# Patient Record
Sex: Male | Born: 2005 | Race: White | Hispanic: Yes | Marital: Single | Smoking: Never smoker
Health system: Southern US, Community
[De-identification: ages and names within clinical notes are randomized; demographics above are authoritative.]

---
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
## 2023-10-01 IMAGING — DX DG NASAL BONES 3+V
3 series · 3 of 3 positions shown · non-contrast
Comparison: None.

CLINICAL DATA: Trauma to nose while wrestling today. Nasal pain,
swelling, and deformity.

EXAM:
NASAL BONES - 3+ VIEW

[[person_name]]
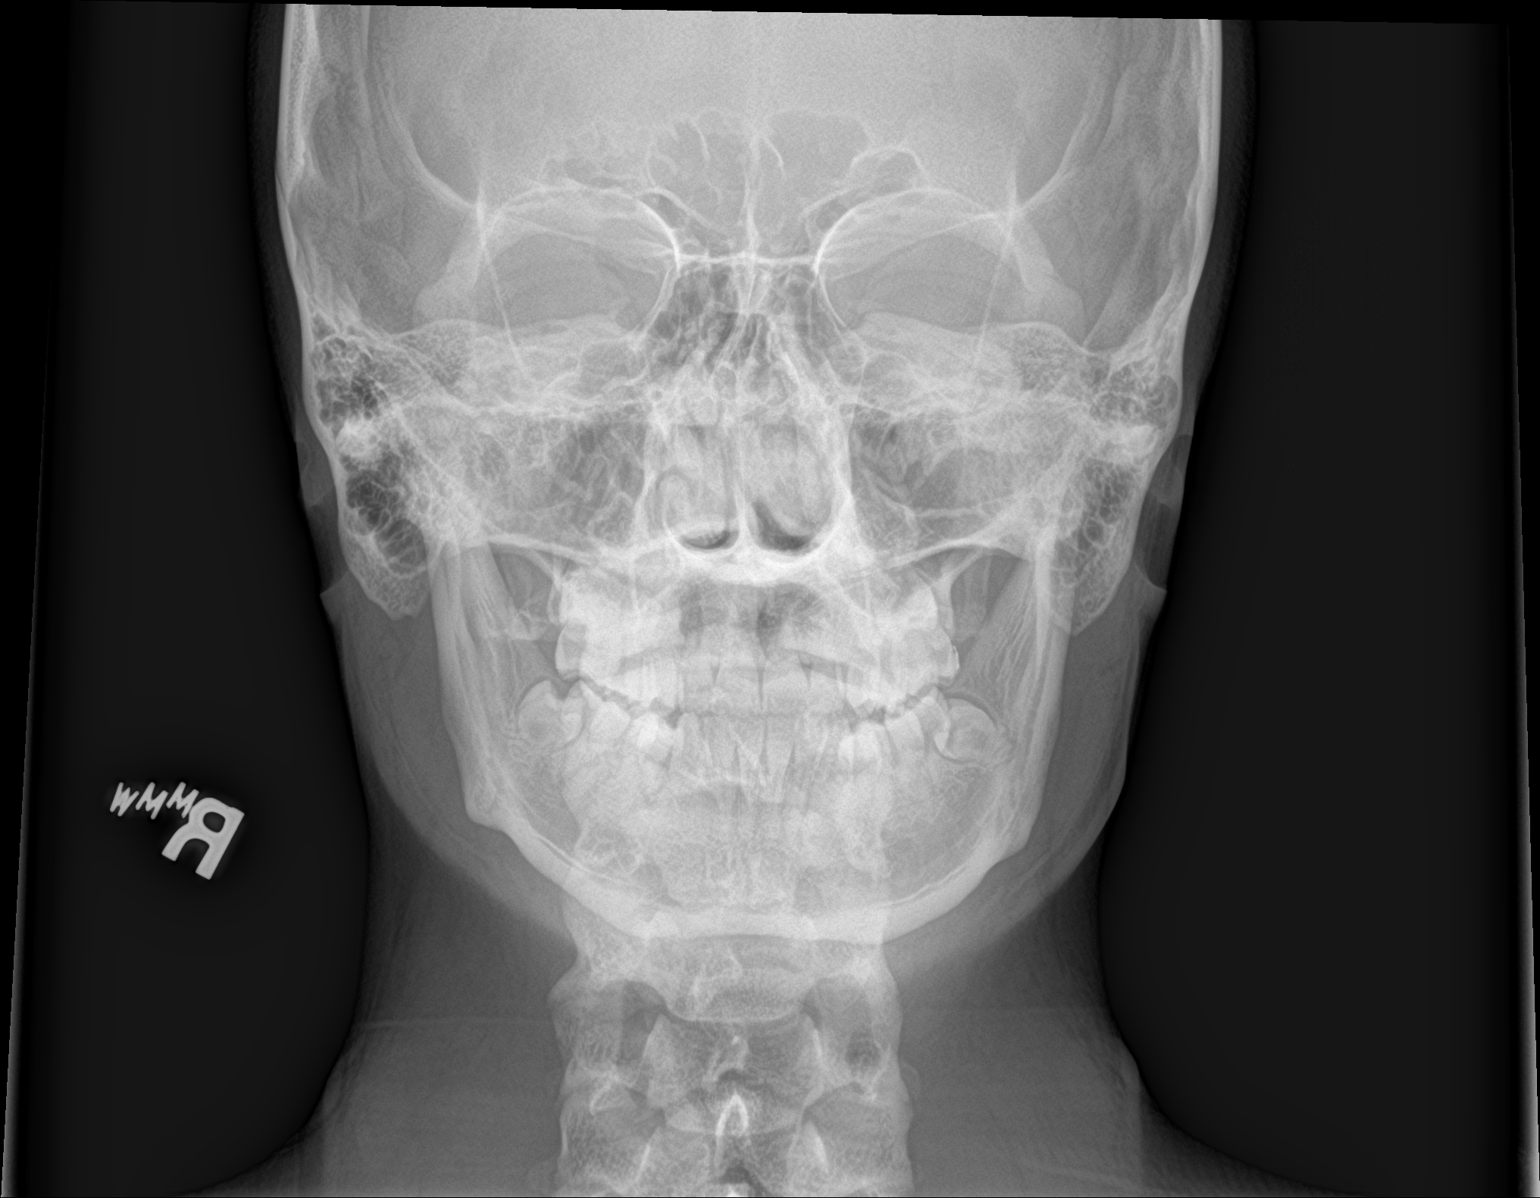

[nasal lat (1 of 2)]
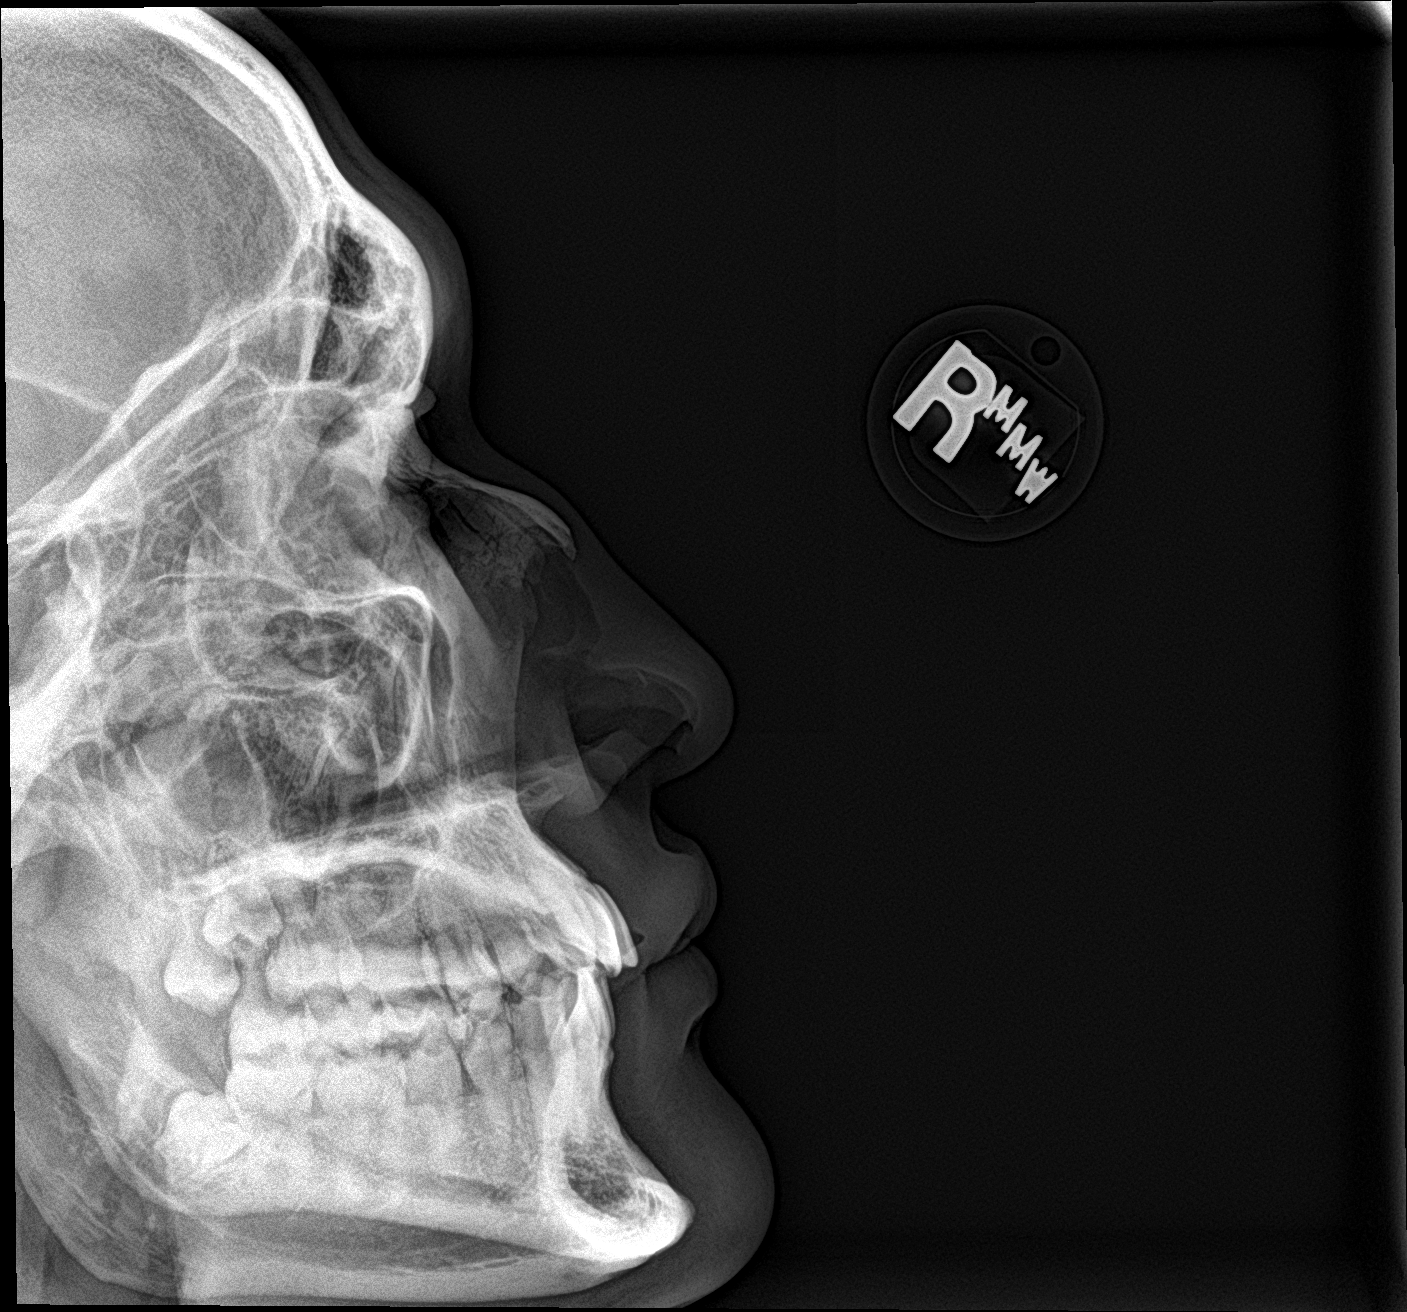

[nasal lat (2 of 2)]
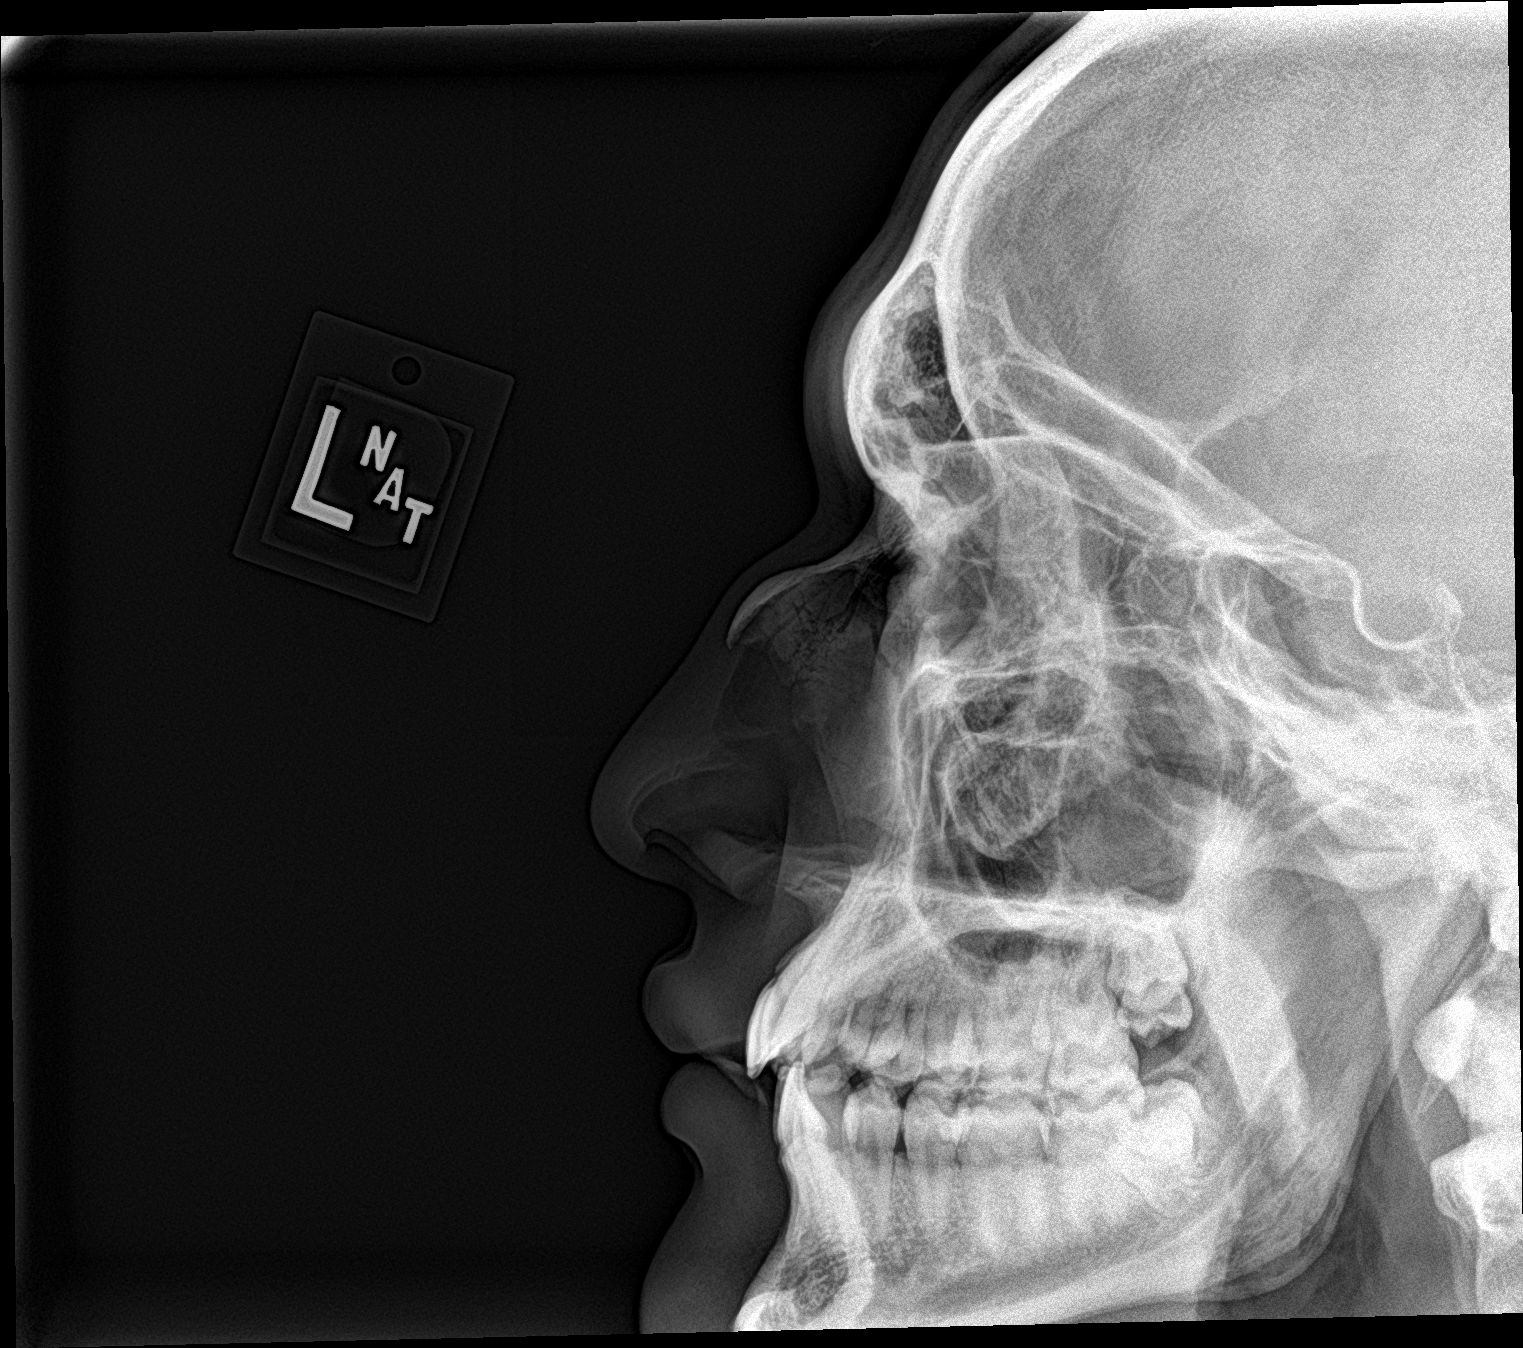

[3 of 3 positions shown; findings below may reference images not displayed]

FINDINGS: No fracture of the nasal bone or anterior nasal spine identified. No
other bone abnormality identified.
IMPRESSION: No nasal bone fracture identified.
